# Patient Record
Sex: Male | Born: 1980 | Race: Black or African American | Hispanic: No | Marital: Married | Smoking: Former smoker
Health system: Southern US, Community
[De-identification: ages and names within clinical notes are randomized; demographics above are authoritative.]

---

## 2013-10-15 ENCOUNTER — Ambulatory Visit (INDEPENDENT_AMBULATORY_CARE_PROVIDER_SITE_OTHER): Payer: Managed Care, Other (non HMO) | Admitting: Family Medicine

## 2013-10-15 VITALS — BP 122/80 | HR 79 | Temp 97.6°F | Resp 18 | Ht 72.0 in | Wt 242.0 lb

## 2013-10-15 DIAGNOSIS — R739 Hyperglycemia, unspecified: Secondary | ICD-10-CM

## 2013-10-15 DIAGNOSIS — E663 Overweight: Secondary | ICD-10-CM

## 2013-10-15 DIAGNOSIS — R7309 Other abnormal glucose: Secondary | ICD-10-CM

## 2013-10-15 DIAGNOSIS — Z23 Encounter for immunization: Secondary | ICD-10-CM

## 2013-10-15 DIAGNOSIS — E7800 Pure hypercholesterolemia, unspecified: Secondary | ICD-10-CM

## 2013-10-15 DIAGNOSIS — Z0000 Encounter for general adult medical examination without abnormal findings: Secondary | ICD-10-CM

## 2013-10-15 LAB — COMPLETE METABOLIC PANEL WITH GFR
ALK PHOS: 60 U/L (ref 39–117)
ALT: 66 U/L — ABNORMAL HIGH (ref 0–53)
AST: 37 U/L (ref 0–37)
Albumin: 4.7 g/dL (ref 3.5–5.2)
BUN: 13 mg/dL (ref 6–23)
CO2: 20 mEq/L (ref 19–32)
Calcium: 9.2 mg/dL (ref 8.4–10.5)
Chloride: 104 mEq/L (ref 96–112)
Creat: 0.78 mg/dL (ref 0.50–1.35)
GFR, Est African American: >89 mL/min
GFR, Est Non African American: >89 mL/min
GLUCOSE: 109 mg/dL — AB (ref 70–99)
POTASSIUM: 4.3 meq/L (ref 3.5–5.3)
Sodium: 135 mEq/L (ref 135–145)
TOTAL PROTEIN: 7.2 g/dL (ref 6.0–8.3)
Total Bilirubin: 0.3 mg/dL (ref 0.2–1.2)

## 2013-10-15 LAB — CBC
HEMATOCRIT: 38 % — AB (ref 39.0–52.0)
Hemoglobin: 13 g/dL (ref 13.0–17.0)
MCH: 26.6 pg (ref 26.0–34.0)
MCHC: 34.2 g/dL (ref 30.0–36.0)
MCV: 77.9 fL — AB (ref 78.0–100.0)
Platelets: 336 10*3/uL (ref 150–400)
RBC: 4.88 MIL/uL (ref 4.22–5.81)
RDW: 14.5 % (ref 11.5–15.5)
WBC: 5.1 10*3/uL (ref 4.0–10.5)

## 2013-10-15 LAB — GLUCOSE, POCT (MANUAL RESULT ENTRY): POC Glucose: 97 mg/dl (ref 70–99)

## 2013-10-15 LAB — POCT GLYCOSYLATED HEMOGLOBIN (HGB A1C): HEMOGLOBIN A1C: 5.9

## 2013-10-15 LAB — TSH: TSH: 3.019 u[IU]/mL (ref 0.350–4.500)

## 2013-10-15 LAB — LIPID PANEL
CHOL/HDL RATIO: 4.8 ratio
Cholesterol: 211 mg/dL — ABNORMAL HIGH (ref 0–200)
HDL: 44 mg/dL (ref >39)
LDL Cholesterol: 145 mg/dL — ABNORMAL HIGH (ref 0–99)
Triglycerides: 109 mg/dL (ref <150)
VLDL: 22 mg/dL (ref 0–40)

## 2013-10-15 NOTE — Progress Notes (Addendum)
Subjective:  This chart was scribed for Shade Flood, MD by Leone Payor, ED Scribe. This patient was seen in room 2 and the patient's care was started 10:00 AM.    Patient ID: Nicholas Mendoza, male    DOB: [birth year], 33 y.o.   MRN: [id number]  HPI HPI Comments: Nicholas Mendoza is a 33 y.o. male who presents to [facility] requesting an annual physical exam today. Patient states he had a biometric exam last October/November 2014 and states he had elevated cholesterol and blood sugar levels. He also reports gaining about 40 lbs in the past 1-1.5 years. Patient states his eating habits have worsened and he is no longer exercising. He reports eating fast food once a day.   He reports both parents have history of DM. He states both are not very heavyset.  His last tetanus is unknown. He denies depression symptoms. He has been married for the past 10 months and has a 64 week old newborn at home. He is a daily 2-3 cigarettes smoker. He is not followed by an ophthalmologist or dentist.   There are no active problems to display for this patient.  History reviewed. No pertinent past medical history. History reviewed. No pertinent past surgical history. No Known Allergies Prior to Admission medications   Not on File   History   Social History  . Marital Status: Married    Spouse Name: N/A    Number of Children: N/A  . Years of Education: N/A   Occupational History  . Not on file.   Social History Main Topics  . Smoking status: Current Every Day Smoker  . Smokeless tobacco: Not on file  . Alcohol Use: Yes  . Drug Use: No  . Sexual Activity: Not on file   Other Topics Concern  . Not on file   Social History Narrative  . No narrative on file     Review of Systems  Constitutional: Negative for fatigue and unexpected weight change.  Eyes: Negative for visual disturbance.  Respiratory: Negative for cough, chest tightness and shortness of breath.   Cardiovascular: Negative for chest pain,  palpitations and leg swelling.  Gastrointestinal: Negative for abdominal pain and blood in stool.  Neurological: Negative for dizziness, light-headedness and headaches.  All other systems reviewed and are negative.  13 point review of systems per patient health survey noted.  Negative other than as indicated above.      Objective:   Physical Exam  Vitals reviewed. Constitutional: He is oriented to person, place, and time. He appears well-developed and well-nourished.  Overweight  HENT:  Head: Normocephalic and atraumatic.  Right Ear: External ear normal.  Left Ear: External ear normal.  Mouth/Throat: Oropharynx is clear and moist.  Eyes: Conjunctivae and EOM are normal. Pupils are equal, round, and reactive to light.  Neck: Normal range of motion. Neck supple. No thyromegaly present.  Cardiovascular: Normal rate, regular rhythm, normal heart sounds and intact distal pulses.   Pulmonary/Chest: Effort normal and breath sounds normal. No respiratory distress. He has no wheezes.  Abdominal: Soft. He exhibits no distension. There is no tenderness. Hernia confirmed negative in the right inguinal area and confirmed negative in the left inguinal area.  Genitourinary:  Pearly papules of penis noted on corona. Discussed benign nature.   Musculoskeletal: Normal range of motion. He exhibits no edema and no tenderness.  Lymphadenopathy:    He has no cervical adenopathy.  Neurological: He is alert and oriented to person, place, and  time. He has normal reflexes.  Skin: Skin is warm and dry.  Psychiatric: He has a normal mood and affect. His behavior is normal.     Filed Vitals:   10/15/13 0946  BP: 122/80  Pulse: 79  Temp: 97.6 F (36.4 C)  TempSrc: Oral  Resp: 18  Height: 6' (1.829 m)  Weight: 242 lb (109.77 kg)  SpO2: 99%   Body mass index is 32.81 kg/(m^2).  Results for orders placed in visit on 10/15/13  GLUCOSE, POCT (MANUAL RESULT ENTRY)      Result Value Ref Range   POC  Glucose 97  70 - 99 mg/dl  POCT GLYCOSYLATED HEMOGLOBIN (HGB A1C)      Result Value Ref Range   Hemoglobin A1C 5.9        Assessment & Plan:   Nicholas Mendoza is a 33 y.o. male Hyperglycemia - Plan: POCT glucose (manual entry), POCT glycosylated hemoglobin (Hb A1C), Lipid panel, COMPLETE METABOLIC PANEL WITH GFR, CBC, TSH - prediabetes.  Wt loss and exercise with diet changes stressed as initial treatment. Discussed metabolic syndrome - other labs pending - recheck in 3 months.  Consider intensive behavioral counseling/nutritionist if not improving.   Pure hypercholesterolemia - Plan: POCT glucose (manual entry), POCT glycosylated hemoglobin (Hb A1C), Lipid panel, COMPLETE METABOLIC PANEL WITH GFR, CBC, TSH - by hx - recheck lipid panel.   Need for prophylactic vaccination with combined diphtheria-tetanus-pertussis (DTP) vaccine - Plan: POCT glucose (manual entry), POCT glycosylated hemoglobin (Hb A1C), Lipid panel, COMPLETE METABOLIC PANEL WITH GFR, CBC, TSH - tdap given.   Overweight - Plan: POCT glucose (manual entry), POCT glycosylated hemoglobin (Hb A1C), Lipid panel, COMPLETE METABOLIC PANEL WITH GFR, CBC, TSH - as above.   Routine general medical examination at a health care facility - Plan: POCT glucose (manual entry), POCT glycosylated hemoglobin (Hb A1C), Lipid panel, COMPLETE METABOLIC PANEL WITH GFR, CBC, TSH - labs above. Anticipatory guidance given.    No orders of the defined types were placed in this encounter.   Patient Instructions  Work on exercise as discussed - minimum of 150 minutes moderate exercise per week, or 75 minutes of strenuous exercise (jogging).  Decrease soda and fast food to help with weight loss. Your blood sugar is at "prediabetes" range. See information below, but weight loss important as initial treatment.  Recheck with me in next 3 months to see how this is going.  [place] offers smoking cessation clinics. Registration is required. To register call  [phone] or register online at [email or web address].  You should receive a call or letter about your lab results within the next week to 10 days.      Keeping you healthy  Get these tests  Blood pressure- Have your blood pressure checked once a year by your healthcare provider.  Normal blood pressure is 120/80.  Weight- Have your body mass index (BMI) calculated to screen for obesity.  BMI is a measure of body fat based on height and weight. You can also calculate your own BMI at [email or web address].west-esparza.com/.  Cholesterol- Have your cholesterol checked regularly starting at age 45, sooner may be necessary if you have diabetes, high blood pressure, if a family member developed heart diseases at an early age or if you smoke.   Chlamydia, HIV, and other sexual transmitted disease- Get screened each year until the age of 46 then within three months of each new sexual partner.  Diabetes- Have your blood sugar checked regularly if you have high blood pressure,  high cholesterol, a family history of diabetes or if you are overweight.  Get these vaccines  Flu shot- Every fall.  Tetanus shot- Every 10 years.  Menactra- Single dose; prevents meningitis.  Take these steps  Don't smoke- If you do smoke, ask your healthcare provider about quitting. For tips on how to quit, go to www.smokefree.gov or call 1-800-QUIT-NOW.  Be physically active- Exercise 5 days a week for at least 30 minutes.  If you are not already physically active start slow and gradually work up to 30 minutes of moderate physical activity.  Examples of moderate activity include walking briskly, mowing the yard, dancing, swimming bicycling, etc.  Eat a healthy diet- Eat a variety of healthy foods such as fruits, vegetables, low fat milk, low fat cheese, yogurt, lean meats, poultry, fish, beans, tofu, etc.  For more information on healthy eating, go to www.thenutritionsource.org  Drink alcohol in moderation- Limit alcohol intake two  drinks or less a day.  Never drink and drive.  Dentist- Brush and floss teeth twice daily; visit your dentis twice a year.  Depression-Your emotional health is as important as your physical health.  If you're feeling down, losing interest in things you normally enjoy please talk with your healthcare provider.  Gun Safety- If you keep a gun in your home, keep it unloaded and with the safety lock on.  Bullets should be stored separately.  Helmet use- Always wear a helmet when riding a motorcycle, bicycle, rollerblading or skateboarding.  Safe sex- If you may be exposed to a sexually transmitted infection, use a condom  Seat belts- Seat bels can save your life; always wear one.  Smoke/Carbon Monoxide detectors- These detectors need to be installed on the appropriate level of your home.  Replace batteries at least once a year.  Skin Cancer- When out in the sun, cover up and use sunscreen SPF 15 or higher.  Violence- If anyone is threatening or hurting you, please tell your healthcare provider.  Hyperglycemia Hyperglycemia occurs when the glucose (sugar) in your blood is too high. Hyperglycemia can happen for many reasons, but it most often happens to people who do not know they have diabetes or are not managing their diabetes properly.  CAUSES  Whether you have diabetes or not, there are other causes of hyperglycemia. Hyperglycemia can occur when you have diabetes, but it can also occur in other situations that you might not be as aware of, such as: Diabetes  If you have diabetes and are having problems controlling your blood glucose, hyperglycemia could occur because of some of the following reasons:  Not following your meal plan.  Not taking your diabetes medications or not taking it properly.  Exercising less or doing less activity than you normally do.  Being sick. Pre-diabetes  This cannot be ignored. Before people develop Type 2 diabetes, they almost always have "pre-diabetes."  This is when your blood glucose levels are higher than normal, but not yet high enough to be diagnosed as diabetes. Research has shown that some long-term damage to the body, especially the heart and circulatory system, may already be occurring during pre-diabetes. If you take action to manage your blood glucose when you have pre-diabetes, you may delay or prevent Type 2 diabetes from developing. Stress  If you have diabetes, you may be "diet" controlled or on oral medications or insulin to control your diabetes. However, you may find that your blood glucose is higher than usual in the hospital whether you have diabetes or  not. This is often referred to as "stress hyperglycemia." Stress can elevate your blood glucose. This happens because of hormones put out by the body during times of stress. If stress has been the cause of your high blood glucose, it can be followed regularly by your caregiver. That way he/she can make sure your hyperglycemia does not continue to get worse or progress to diabetes. Steroids  Steroids are medications that act on the infection fighting system (immune system) to block inflammation or infection. One side effect can be a rise in blood glucose. Most people can produce enough extra insulin to allow for this rise, but for those who cannot, steroids make blood glucose levels go even higher. It is not unusual for steroid treatments to "uncover" diabetes that is developing. It is not always possible to determine if the hyperglycemia will go away after the steroids are stopped. A special blood test called an A1c is sometimes done to determine if your blood glucose was elevated before the steroids were started. SYMPTOMS  Thirsty.  Frequent urination.  Dry mouth.  Blurred vision.  Tired or fatigue.  Weakness.  Sleepy.  Tingling in feet or leg. DIAGNOSIS  Diagnosis is made by monitoring blood glucose in one or all of the following ways:  A1c test. This is a chemical  found in your blood.  Fingerstick blood glucose monitoring.  Laboratory results. TREATMENT  First, knowing the cause of the hyperglycemia is important before the hyperglycemia can be treated. Treatment may include, but is not be limited to:  Education.  Change or adjustment in medications.  Change or adjustment in meal plan.  Treatment for an illness, infection, etc.  More frequent blood glucose monitoring.  Change in exercise plan.  Decreasing or stopping steroids.  Lifestyle changes. HOME CARE INSTRUCTIONS   Test your blood glucose as directed.  Exercise regularly. Your caregiver will give you instructions about exercise. Pre-diabetes or diabetes which comes on with stress is helped by exercising.  Eat wholesome, balanced meals. Eat often and at regular, fixed times. Your caregiver or nutritionist will give you a meal plan to guide your sugar intake.  Being at an ideal weight is important. If needed, losing as little as 10 to 15 pounds may help improve blood glucose levels. SEEK MEDICAL CARE IF:   You have questions about medicine, activity, or diet.  You continue to have symptoms (problems such as increased thirst, urination, or weight gain). SEEK IMMEDIATE MEDICAL CARE IF:   You are vomiting or have diarrhea.  Your breath smells fruity.  You are breathing faster or slower.  You are very sleepy or incoherent.  You have numbness, tingling, or pain in your feet or hands.  You have chest pain.  Your symptoms get worse even though you have been following your caregiver's orders.  If you have any other questions or concerns. Document Released: 12/21/2000 Document Revised: 09/19/2011 Document Reviewed: 10/24/2011 [facility] Patient Information 2014 [place], [place].          I personally performed the services described in this documentation, which was scribed in my presence. The recorded information has been reviewed and considered, and addended by me as  needed.

## 2013-10-15 NOTE — Patient Instructions (Addendum)
Work on exercise as discussed - minimum of 150 minutes moderate exercise per week, or 75 minutes of strenuous exercise (jogging).  Decrease soda and fast food to help with weight loss. Your blood sugar is at "prediabetes" range. See information below, but weight loss important as initial treatment.  Recheck with me in next 3 months to see how this is going.  [place] offers smoking cessation clinics. Registration is required. To register call [phone] or register online at [email or web address].  You should receive a call or letter about your lab results within the next week to 10 days.      Keeping you healthy  Get these tests  Blood pressure- Have your blood pressure checked once a year by your healthcare provider.  Normal blood pressure is 120/80.  Weight- Have your body mass index (BMI) calculated to screen for obesity.  BMI is a measure of body fat based on height and weight. You can also calculate your own BMI at [email or web address].west-esparza.com/.  Cholesterol- Have your cholesterol checked regularly starting at age 69, sooner may be necessary if you have diabetes, high blood pressure, if a family member developed heart diseases at an early age or if you smoke.   Chlamydia, HIV, and other sexual transmitted disease- Get screened each year until the age of 76 then within three months of each new sexual partner.  Diabetes- Have your blood sugar checked regularly if you have high blood pressure, high cholesterol, a family history of diabetes or if you are overweight.  Get these vaccines  Flu shot- Every fall.  Tetanus shot- Every 10 years.  Menactra- Single dose; prevents meningitis.  Take these steps  Don't smoke- If you do smoke, ask your healthcare provider about quitting. For tips on how to quit, go to www.smokefree.gov or call 1-800-QUIT-NOW.  Be physically active- Exercise 5 days a week for at least 30 minutes.  If you are not already physically active start slow and gradually  work up to 30 minutes of moderate physical activity.  Examples of moderate activity include walking briskly, mowing the yard, dancing, swimming bicycling, etc.  Eat a healthy diet- Eat a variety of healthy foods such as fruits, vegetables, low fat milk, low fat cheese, yogurt, lean meats, poultry, fish, beans, tofu, etc.  For more information on healthy eating, go to www.thenutritionsource.org  Drink alcohol in moderation- Limit alcohol intake two drinks or less a day.  Never drink and drive.  Dentist- Brush and floss teeth twice daily; visit your dentis twice a year.  Depression-Your emotional health is as important as your physical health.  If you're feeling down, losing interest in things you normally enjoy please talk with your healthcare provider.  Gun Safety- If you keep a gun in your home, keep it unloaded and with the safety lock on.  Bullets should be stored separately.  Helmet use- Always wear a helmet when riding a motorcycle, bicycle, rollerblading or skateboarding.  Safe sex- If you may be exposed to a sexually transmitted infection, use a condom  Seat belts- Seat bels can save your life; always wear one.  Smoke/Carbon Monoxide detectors- These detectors need to be installed on the appropriate level of your home.  Replace batteries at least once a year.  Skin Cancer- When out in the sun, cover up and use sunscreen SPF 15 or higher.  Violence- If anyone is threatening or hurting you, please tell your healthcare provider.  Hyperglycemia Hyperglycemia occurs when the glucose (sugar) in your blood is too high.  Hyperglycemia can happen for many reasons, but it most often happens to people who do not know they have diabetes or are not managing their diabetes properly.  CAUSES  Whether you have diabetes or not, there are other causes of hyperglycemia. Hyperglycemia can occur when you have diabetes, but it can also occur in other situations that you might not be as aware of, such  as: Diabetes  If you have diabetes and are having problems controlling your blood glucose, hyperglycemia could occur because of some of the following reasons:  Not following your meal plan.  Not taking your diabetes medications or not taking it properly.  Exercising less or doing less activity than you normally do.  Being sick. Pre-diabetes  This cannot be ignored. Before people develop Type 2 diabetes, they almost always have "pre-diabetes." This is when your blood glucose levels are higher than normal, but not yet high enough to be diagnosed as diabetes. Research has shown that some long-term damage to the body, especially the heart and circulatory system, may already be occurring during pre-diabetes. If you take action to manage your blood glucose when you have pre-diabetes, you may delay or prevent Type 2 diabetes from developing. Stress  If you have diabetes, you may be "diet" controlled or on oral medications or insulin to control your diabetes. However, you may find that your blood glucose is higher than usual in the hospital whether you have diabetes or not. This is often referred to as "stress hyperglycemia." Stress can elevate your blood glucose. This happens because of hormones put out by the body during times of stress. If stress has been the cause of your high blood glucose, it can be followed regularly by your caregiver. That way he/she can make sure your hyperglycemia does not continue to get worse or progress to diabetes. Steroids  Steroids are medications that act on the infection fighting system (immune system) to block inflammation or infection. One side effect can be a rise in blood glucose. Most people can produce enough extra insulin to allow for this rise, but for those who cannot, steroids make blood glucose levels go even higher. It is not unusual for steroid treatments to "uncover" diabetes that is developing. It is not always possible to determine if the hyperglycemia  will go away after the steroids are stopped. A special blood test called an A1c is sometimes done to determine if your blood glucose was elevated before the steroids were started. SYMPTOMS  Thirsty.  Frequent urination.  Dry mouth.  Blurred vision.  Tired or fatigue.  Weakness.  Sleepy.  Tingling in feet or leg. DIAGNOSIS  Diagnosis is made by monitoring blood glucose in one or all of the following ways:  A1c test. This is a chemical found in your blood.  Fingerstick blood glucose monitoring.  Laboratory results. TREATMENT  First, knowing the cause of the hyperglycemia is important before the hyperglycemia can be treated. Treatment may include, but is not be limited to:  Education.  Change or adjustment in medications.  Change or adjustment in meal plan.  Treatment for an illness, infection, etc.  More frequent blood glucose monitoring.  Change in exercise plan.  Decreasing or stopping steroids.  Lifestyle changes. HOME CARE INSTRUCTIONS   Test your blood glucose as directed.  Exercise regularly. Your caregiver will give you instructions about exercise. Pre-diabetes or diabetes which comes on with stress is helped by exercising.  Eat wholesome, balanced meals. Eat often and at regular, fixed times. Your caregiver or  nutritionist will give you a meal plan to guide your sugar intake.  Being at an ideal weight is important. If needed, losing as little as 10 to 15 pounds may help improve blood glucose levels. SEEK MEDICAL CARE IF:   You have questions about medicine, activity, or diet.  You continue to have symptoms (problems such as increased thirst, urination, or weight gain). SEEK IMMEDIATE MEDICAL CARE IF:   You are vomiting or have diarrhea.  Your breath smells fruity.  You are breathing faster or slower.  You are very sleepy or incoherent.  You have numbness, tingling, or pain in your feet or hands.  You have chest pain.  Your symptoms get  worse even though you have been following your caregiver's orders.  If you have any other questions or concerns. Document Released: 12/21/2000 Document Revised: 09/19/2011 Document Reviewed: 10/24/2011 [facility] Patient Information 2014 [place], [place].

## 2013-10-17 NOTE — Progress Notes (Signed)
Left a message for patient to return call for follow up appt in three months for folllow up appointment

## 2014-01-20 ENCOUNTER — Ambulatory Visit: Payer: Self-pay | Admitting: Family Medicine

## 2018-10-05 ENCOUNTER — Emergency Department (HOSPITAL_COMMUNITY)
Admission: EM | Admit: 2018-10-05 | Discharge: 2018-10-05 | Disposition: A | Discharge status: Home / Self Care | Payer: Commercial Managed Care - PPO | Attending: Emergency Medicine | Admitting: Emergency Medicine

## 2018-10-05 ENCOUNTER — Other Ambulatory Visit: Payer: Self-pay

## 2018-10-05 ENCOUNTER — Emergency Department (HOSPITAL_COMMUNITY): Payer: Commercial Managed Care - PPO

## 2018-10-05 ENCOUNTER — Encounter (HOSPITAL_COMMUNITY): Payer: Self-pay

## 2018-10-05 DIAGNOSIS — F17210 Nicotine dependence, cigarettes, uncomplicated: Secondary | ICD-10-CM | POA: Insufficient documentation

## 2018-10-05 DIAGNOSIS — Z79899 Other long term (current) drug therapy: Secondary | ICD-10-CM | POA: Insufficient documentation

## 2018-10-05 DIAGNOSIS — R0789 Other chest pain: Secondary | ICD-10-CM | POA: Diagnosis not present

## 2018-10-05 LAB — TROPONIN I: Troponin I: <0.03 ng/mL (ref <0.03)

## 2018-10-05 LAB — CBC WITH DIFFERENTIAL/PLATELET
ABS IMMATURE GRANULOCYTES: 0.01 10*3/uL (ref 0.00–0.07)
Basophils Absolute: 0 10*3/uL (ref 0.0–0.1)
Basophils Relative: 1 %
Eosinophils Absolute: 0.1 10*3/uL (ref 0.0–0.5)
Eosinophils Relative: 3 %
HCT: 40.1 % (ref 39.0–52.0)
Hemoglobin: 13.4 g/dL (ref 13.0–17.0)
Immature Granulocytes: 0 %
Lymphocytes Relative: 39 %
Lymphs Abs: 1.6 10*3/uL (ref 0.7–4.0)
MCH: 26.9 pg (ref 26.0–34.0)
MCHC: 33.4 g/dL (ref 30.0–36.0)
MCV: 80.5 fL (ref 80.0–100.0)
MONOS PCT: 10 %
Monocytes Absolute: 0.4 10*3/uL (ref 0.1–1.0)
Neutro Abs: 2 10*3/uL (ref 1.7–7.7)
Neutrophils Relative %: 47 %
Platelets: 292 10*3/uL (ref 150–400)
RBC: 4.98 MIL/uL (ref 4.22–5.81)
RDW: 14.9 % (ref 11.5–15.5)
WBC: 4.2 10*3/uL (ref 4.0–10.5)
nRBC: 0 % (ref 0.0–0.2)

## 2018-10-05 LAB — COMPREHENSIVE METABOLIC PANEL
ALT: 45 U/L — AB (ref 0–44)
AST: 35 U/L (ref 15–41)
Albumin: 4.8 g/dL (ref 3.5–5.0)
Alkaline Phosphatase: 40 U/L (ref 38–126)
Anion gap: 14 (ref 5–15)
BUN: 8 mg/dL (ref 6–20)
CO2: 21 mmol/L — ABNORMAL LOW (ref 22–32)
Calcium: 9.6 mg/dL (ref 8.9–10.3)
Chloride: 102 mmol/L (ref 98–111)
Creatinine, Ser: 0.73 mg/dL (ref 0.61–1.24)
GFR calc Af Amer: >60 mL/min (ref >60)
GFR calc non Af Amer: >60 mL/min (ref >60)
Glucose, Bld: 95 mg/dL (ref 70–99)
Potassium: 4 mmol/L (ref 3.5–5.1)
Sodium: 137 mmol/L (ref 135–145)
Total Bilirubin: 1 mg/dL (ref 0.3–1.2)
Total Protein: 7.7 g/dL (ref 6.5–8.1)

## 2018-10-05 MED ORDER — METHOCARBAMOL 500 MG PO TABS: 500.0000 mg | ORAL_TABLET | Freq: Two times a day (BID) | ORAL | 0 refills | Status: AC

## 2018-10-05 NOTE — ED Provider Notes (Signed)
[facility] EMERGENCY DEPARTMENT Provider Note   CSN: [id number] Arrival date & time: 10/05/18  1516    History   Chief Complaint Chief Complaint  Patient presents with  . Abnormal ECG    HPI Nicholas Mendoza is a 38 y.o. male with no significant past medical history who presents the emergency department directly from urgent care after he had an EKG that was read as concerning.  Patient states that he presented to an urgent care today as he began to experience right-sided chest pain yesterday afternoon.  He states that the pain is located over his right pectoral muscle and feels like a sharpness.  He states that it is constant and is worsened with sitting up or movement of his neck.  He denies any injury to this area or activity that would have caused the symptoms.  He did state that he has been working at home with his young children and may have injured it while crawling them.  He denies any fevers, chills, cough/cold/congestion, dyspnea, diaphoresis, nausea/vomiting, abdominal pain, or changes in bowel or bladder habits.      Illness  Severity:  Moderate Onset quality:  Gradual Duration:  2 days Timing:  Constant Progression:  Improving Chronicity:  New Associated symptoms: chest pain   Associated symptoms: no abdominal pain, no cough, no ear pain, no fever, no rash, no shortness of breath, no sore throat and no vomiting     No past medical history on file.  There are no active problems to display for this patient.   No past surgical history on file.      Home Medications    Prior to Admission medications   Medication Sig Start Date End Date Taking? Authorizing Provider  acetaminophen (TYLENOL) 500 MG tablet Take 1,000 mg by mouth every 6 (six) hours as needed for moderate pain.   Yes [provider]  methocarbamol (ROBAXIN) 500 MG tablet Take 1 tablet (500 mg total) by mouth 2 (two) times daily for 10 doses. 10/05/18 10/10/18  Leonette Monarch, MD     Family History Family History  Problem Relation Age of Onset  . Diabetes Mother   . Diabetes Father     Social History Social History   Tobacco Use  . Smoking status: Current Every Day Smoker    Packs/day: 0.50    Types: Cigarettes  . Smokeless tobacco: Never Used  Substance Use Topics  . Alcohol use: Yes  . Drug use: No     Allergies   Patient has no known allergies.   Review of Systems Review of Systems  Constitutional: Negative for chills and fever.  HENT: Negative for ear pain and sore throat.   Eyes: Negative for pain and visual disturbance.  Respiratory: Negative for cough and shortness of breath.   Cardiovascular: Positive for chest pain. Negative for palpitations.  Gastrointestinal: Negative for abdominal pain and vomiting.  Genitourinary: Negative for dysuria and hematuria.  Musculoskeletal: Negative for arthralgias and back pain.  Skin: Negative for color change and rash.  Neurological: Negative for seizures and syncope.  All other systems reviewed and are negative.    Physical Exam Updated Vital Signs BP (!) 139/106   Pulse 78   Temp (!) 97.4 F (36.3 C) (Oral)   Resp 18   SpO2 99%   Physical Exam Vitals signs and nursing note reviewed.  Constitutional:      Appearance: He is well-developed.  HENT:     Head: Normocephalic and atraumatic.  Eyes:     Conjunctiva/sclera: Conjunctivae normal.  Neck:     Musculoskeletal: Neck supple.  Cardiovascular:     Rate and Rhythm: Normal rate and regular rhythm.     Heart sounds: No murmur.  Pulmonary:     Effort: Pulmonary effort is normal. No respiratory distress.     Breath sounds: Normal breath sounds.  Abdominal:     Palpations: Abdomen is soft.     Tenderness: There is no abdominal tenderness.  Musculoskeletal:     Comments: No tenderness to palpation overlying the right chest wall.  There is no bony deformities or palpable crepitus.  No overlying erythematous rash or vesicular lesions.   Skin:    General: Skin is warm and dry.  Neurological:     General: No focal deficit present.     Mental Status: He is alert and oriented to person, place, and time.     Comments: 5-5 strength in bilateral upper and lower extremities.  Sensation intact throughout.      ED Treatments / Results  Labs (all labs ordered are listed, but only abnormal results are displayed) Labs Reviewed  COMPREHENSIVE METABOLIC PANEL - Abnormal; Notable for the following components:      Result Value   CO2 21 (*)    ALT 45 (*)    All other components within normal limits  CBC WITH DIFFERENTIAL/PLATELET  TROPONIN I    EKG EKG Interpretation  Date/Time:  Friday October 05 2018 15:22:40 EDT Ventricular Rate:  82 PR Interval:  186 QRS Duration: 90 QT Interval:  380 QTC Calculation: 443 R Axis:   106 Text Interpretation:  Normal sinus rhythm Rightward axis Anterior infarct , age undetermined Abnormal ECG No prior ECG for comparison. No STEMI Confirmed by Theda Belfast ([id number]) on 10/05/2018 3:28:11 PM   Radiology Dg Chest Portable 1 View  Result Date: 10/05/2018 CLINICAL DATA:  Acute chest pain EXAM: PORTABLE CHEST 1 VIEW COMPARISON:  None. FINDINGS: The heart size and mediastinal contours are within normal limits. Both lungs are clear. The visualized skeletal structures are unremarkable. IMPRESSION: No active disease. Electronically Signed   By: Judie Petit.  Shick M.D.   On: 10/05/2018 16:20    Procedures Procedures (including critical care time)  Medications Ordered in ED Medications - No data to display   Initial Impression / Assessment and Plan / ED Course  I have reviewed the triage vital signs and the nursing notes.  Pertinent labs & imaging results that were available during my care of the patient were reviewed by me and considered in my medical decision making (see chart for details).        Patient is a 38 year old male with no significant past medical history who presented to the  emergency department complaining of right-sided chest pain that is been constant for the past 2 days.  Patient states that the pain is worse with side-to-side movement of his neck and with going from a lying down to seated position.  Very much reproducible with movement.  He was seen in urgent care where EKG was obtained and he was told there were some concerning findings and was sent to the emergency department for evaluation.  On initial evaluation of the patient he was hemodynamically stable nontoxic-appearing.  Patient was afebrile, not tachycardic, mildly hypertensive at 140/99, satting 99% on room air.  Physical exam as detailed above which is remarkable for overall well-appearing young African-American male.  Lungs are clear to auscultation and no murmurs rubs or gallops are  present.  He has equal pulses in all 4 extremities.  No edema of the lower extremities.  Pain is reproducible with lateral rotation of the neck however no tenderness palpation directly over cervical spine and no neurologic deficits appreciated in either of his upper extremities.  EKG was normal sinus rhythm at 82 bpm with right axis deviation.  No evidence of WPW or Brugada.  Patient has no ST or T wave abnormalities concerning for acute ischemia.  Chest x-ray showing no evidence of acute cardiac or pulmonary pathology.  Specifically no signs of focal consolidations or be concerning for pneumonia.  CBC and CMP within normal limits.  Troponin undetectable.  Given patient's description of his symptoms I have high suspicion for musculoskeletal etiology and ACS is felt to be highly unlikely.  He also states that his pain is been constant since onset yesterday leaving delta troponin is unnecessary.  PERC negative leaving PE as also unlikely.  Patient's presenting symptoms most likely secondary to strained chest wall musculature.  Patient was given prescription for Robaxin to return home with.  I went over appropriate medication  usage and side effects.  I discussed concerning signs and symptoms that would necessitate return to the emergency department.  Patient voiced understanding of these instructions and had no further questions at this time.  Nicholas Mendoza was evaluated in Emergency Department on 10/06/2018 for the symptoms described in the history of present illness. He was evaluated in the context of the global COVID-19 pandemic, which necessitated consideration that the patient might be at risk for infection with the SARS-CoV-2 virus that causes COVID-19. Institutional protocols and algorithms that pertain to the evaluation of patients at risk for COVID-19 are in a state of rapid change based on information released by regulatory bodies including the CDC and federal and state organizations. These policies and algorithms were followed during the patient's care in the ED.   Final Clinical Impressions(s) / ED Diagnoses   Final diagnoses:  Chest wall pain    ED Discharge Orders         Ordered    methocarbamol (ROBAXIN) 500 MG tablet  2 times daily     10/05/18 1825           Leonette Monarch, MD 10/06/18 0110    Tegeler, Canary Brim, MD 10/06/18 [phone]

## 2018-10-05 NOTE — ED Triage Notes (Signed)
Pt reports that he had some chest pain that started yesterday afternoon. Pt reports it was a gradual onset. Pt reports that he went to urgent care today and they sent him here for an abnormal EKG. Pt reports the pain has been sharp and is constant and that the pain worsens when he tries to get up and when he moves his head.

## 2019-08-11 IMAGING — DX PORTABLE CHEST - 1 VIEW
1 series · 1 of 1 positions shown · non-contrast
Comparison: None.

CLINICAL DATA: Acute chest pain

EXAM:
PORTABLE CHEST 1 VIEW

[chest]
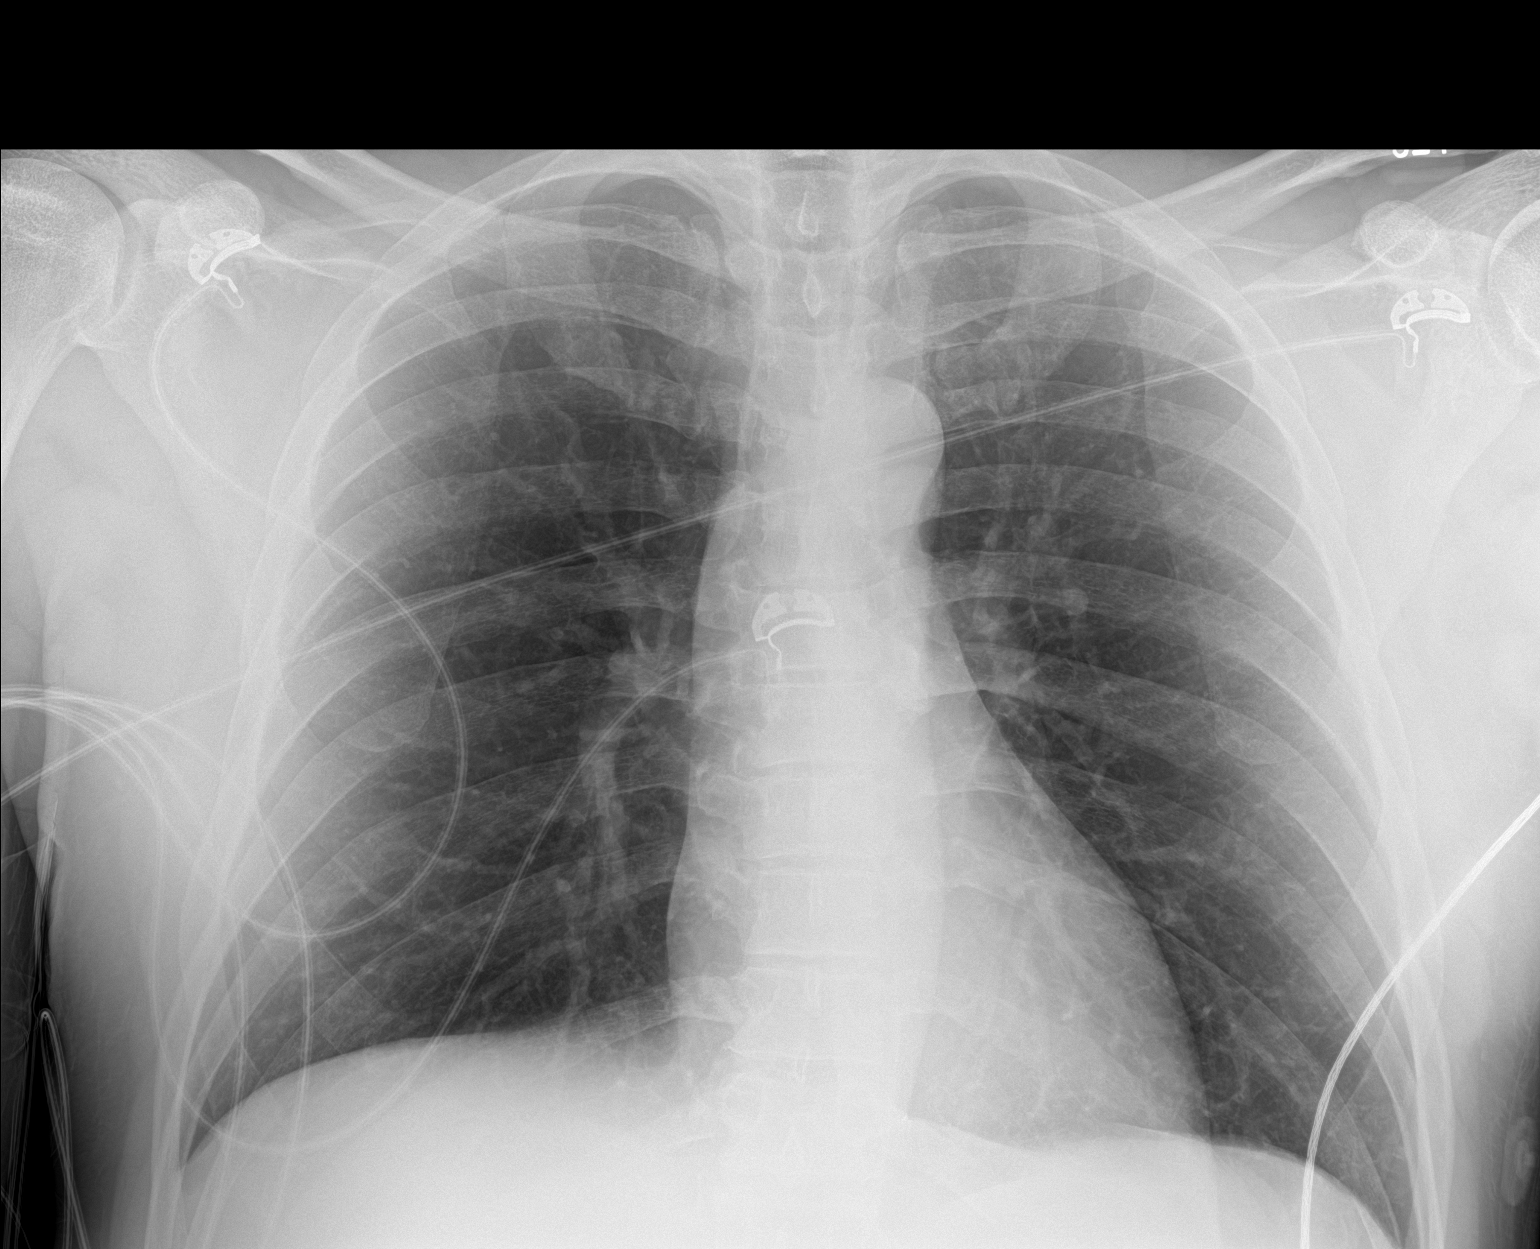

[1 of 1 positions shown; findings below may reference images not displayed]

FINDINGS: The heart size and mediastinal contours are within normal limits.
Both lungs are clear. The visualized skeletal structures are
unremarkable.
IMPRESSION: No active disease.

## 2019-10-18 ENCOUNTER — Ambulatory Visit: Payer: Commercial Managed Care - PPO

## 2019-10-19 ENCOUNTER — Ambulatory Visit: Payer: Commercial Managed Care - PPO

## 2019-10-19 ENCOUNTER — Ambulatory Visit: Payer: Self-pay | Attending: Internal Medicine

## 2020-03-25 ENCOUNTER — Other Ambulatory Visit: Payer: Self-pay | Admitting: Critical Care Medicine

## 2020-03-25 ENCOUNTER — Other Ambulatory Visit: Payer: Self-pay

## 2020-03-25 DIAGNOSIS — Z20822 Contact with and (suspected) exposure to covid-19: Secondary | ICD-10-CM

## 2020-03-27 LAB — SARS-COV-2, NAA 2 DAY TAT

## 2020-03-27 LAB — NOVEL CORONAVIRUS, NAA: SARS-CoV-2, NAA: DETECTED — AB

## 2020-03-28 ENCOUNTER — Encounter: Payer: Self-pay | Admitting: Nurse Practitioner

## 2020-03-28 ENCOUNTER — Telehealth (HOSPITAL_COMMUNITY): Payer: Self-pay | Admitting: Nurse Practitioner

## 2020-03-28 DIAGNOSIS — U071 COVID-19: Secondary | ICD-10-CM

## 2020-03-28 NOTE — Telephone Encounter (Signed)
Sent patient mychart message to discuss covid symptoms and the use of regeneron, a monoclonal antibody infusion for those with mild to moderate Covid symptoms and at a high risk of hospitalization.     Pt is qualified for this infusion at the [facility] due to co-morbid conditions and/or a member of an at-risk group.    Consuello Masse, DNP, AGNP-C [phone] (Infusion Center Hotline)

## 2023-08-01 ENCOUNTER — Other Ambulatory Visit: Payer: Self-pay | Admitting: Nurse Practitioner

## 2023-08-01 DIAGNOSIS — Z789 Other specified health status: Secondary | ICD-10-CM

## 2023-08-01 DIAGNOSIS — R7989 Other specified abnormal findings of blood chemistry: Secondary | ICD-10-CM

## 2023-08-01 DIAGNOSIS — K760 Fatty (change of) liver, not elsewhere classified: Secondary | ICD-10-CM

## 2023-08-14 ENCOUNTER — Other Ambulatory Visit: Payer: Self-pay

## 2023-08-15 ENCOUNTER — Other Ambulatory Visit: Payer: Self-pay

## 2023-08-22 ENCOUNTER — Ambulatory Visit
Admission: RE | Admit: 2023-08-22 | Discharge: 2023-08-22 | Disposition: A | Discharge status: Home / Self Care | Payer: BC Managed Care – PPO | Source: Ambulatory Visit | Attending: Nurse Practitioner | Admitting: Nurse Practitioner

## 2023-08-22 DIAGNOSIS — Z789 Other specified health status: Secondary | ICD-10-CM

## 2023-08-22 DIAGNOSIS — K760 Fatty (change of) liver, not elsewhere classified: Secondary | ICD-10-CM

## 2023-08-22 DIAGNOSIS — R7989 Other specified abnormal findings of blood chemistry: Secondary | ICD-10-CM

## 2024-04-24 ENCOUNTER — Encounter (HOSPITAL_COMMUNITY): Payer: Self-pay

## 2024-04-24 ENCOUNTER — Ambulatory Visit (HOSPITAL_COMMUNITY)
Admission: EM | Admit: 2024-04-24 | Discharge: 2024-04-24 | Disposition: A | Discharge status: Home / Self Care | Attending: Family Medicine | Admitting: Family Medicine

## 2024-04-24 DIAGNOSIS — M7989 Other specified soft tissue disorders: Secondary | ICD-10-CM

## 2024-04-24 DIAGNOSIS — L03114 Cellulitis of left upper limb: Secondary | ICD-10-CM

## 2024-04-24 DIAGNOSIS — T7840XA Allergy, unspecified, initial encounter: Secondary | ICD-10-CM | POA: Diagnosis not present

## 2024-04-24 MED ORDER — CEFTRIAXONE SODIUM 1 G IJ SOLR
1.0000 g | Freq: Once | INTRAMUSCULAR | Status: AC
  Administered 2024-04-24: 1 g via INTRAMUSCULAR

## 2024-04-24 MED ORDER — HYDROCODONE-ACETAMINOPHEN 5-325 MG PO TABS
1.0000 | ORAL_TABLET | Freq: Once | ORAL | Status: AC
  Administered 2024-04-24: 1 via ORAL

## 2024-04-24 MED ORDER — PREDNISONE 20 MG PO TABS: 40.0000 mg | ORAL_TABLET | Freq: Every day | ORAL | 0 refills | Status: AC

## 2024-04-24 MED ORDER — METHYLPREDNISOLONE SODIUM SUCC 125 MG IJ SOLR
125.0000 mg | Freq: Once | INTRAMUSCULAR | Status: AC
  Administered 2024-04-24: 125 mg via INTRAMUSCULAR

## 2024-04-24 MED ORDER — HYDROXYZINE HCL 25 MG PO TABS: ORAL_TABLET | ORAL | 0 refills | Status: AC

## 2024-04-24 MED ORDER — LIDOCAINE HCL (PF) 1 % IJ SOLN
INTRAMUSCULAR | Status: AC
  Filled 2024-04-24: qty 2

## 2024-04-24 MED ORDER — METHYLPREDNISOLONE SODIUM SUCC 125 MG IJ SOLR
INTRAMUSCULAR | Status: AC
  Filled 2024-04-24: qty 2

## 2024-04-24 MED ORDER — HYDROCODONE-ACETAMINOPHEN 5-325 MG PO TABS
ORAL_TABLET | ORAL | Status: AC
  Filled 2024-04-24: qty 1

## 2024-04-24 MED ORDER — CEFTRIAXONE SODIUM 1 G IJ SOLR
INTRAMUSCULAR | Status: AC
  Filled 2024-04-24: qty 10

## 2024-04-24 MED ORDER — AMOXICILLIN-POT CLAVULANATE 875-125 MG PO TABS: 1.0000 | ORAL_TABLET | Freq: Two times a day (BID) | ORAL | 0 refills | Status: AC

## 2024-04-24 NOTE — Medical Student Note (Signed)
 [facility] Insurance account manager Note For educational purposes for Medical, PA and NP students only and not part of the legal medical record.   CSN: [id number] Arrival date & time: 04/24/24  0930      History   Chief Complaint Chief Complaint  Patient presents with   Insect Bite    HPI Nicholas Mendoza is a 43 y.o. male.  Pt has a hx of GERD and elevated LFTs. He presents with L hand swelling that started yesterday after he felt something sting his L 4th digit while working in his yard yesterday. The hand is pruritic with significant swelling. He had a small blister at the site of the sting that has ruptured and has clear drainage. He is not aware of any allergies to insect stings. He denies fever, chills, N/V, or ShOB. He has not taken any medication for his symptoms. He has a wedding ring in place. He still has normal sensation distally.   The history is provided by the patient.    History reviewed. No pertinent past medical history.  There are no active problems to display for this patient.   History reviewed. No pertinent surgical history.     Home Medications    Prior to Admission medications   Medication Sig Start Date End Date Taking? Authorizing Provider  pantoprazole (PROTONIX) 40 MG tablet Take 40 mg by mouth daily. 11/22/23  Yes [provider]  acetaminophen (TYLENOL) 500 MG tablet Take 1,000 mg by mouth every 6 (six) hours as needed for moderate pain.    [provider]    Family History Family History  Problem Relation Age of Onset   Diabetes Mother    Diabetes Father     Social History Social History   Tobacco Use   Smoking status: Former    Current packs/day: 0.50    Types: Cigarettes   Smokeless tobacco: Never  Vaping Use   Vaping status: Every Day  Substance Use Topics   Alcohol use: Yes    Comment: moderate   Drug use: No     Allergies   Patient has no known allergies.   Review of Systems Review of  Systems  Constitutional:  Negative for chills and fever.  HENT:  Negative for trouble swallowing.   Respiratory:  Negative for shortness of breath and wheezing.   Skin:  Positive for rash (pruritis, vesicle).  All other systems reviewed and are negative.    Physical Exam Updated Vital Signs BP (!) 142/100 (BP Location: Right Arm)   Pulse 86   Temp 98.9 F (37.2 C) (Oral)   Resp 16   SpO2 96%   Physical Exam Constitutional:      Appearance: Normal appearance.  HENT:     Mouth/Throat:     Mouth: Mucous membranes are moist.     Pharynx: Oropharynx is clear.  Cardiovascular:     Rate and Rhythm: Normal rate and regular rhythm.     Pulses: Normal pulses.     Heart sounds: Normal heart sounds.  Pulmonary:     Effort: Pulmonary effort is normal.     Breath sounds: Normal breath sounds.  Musculoskeletal:     Right hand: Normal.     Left hand: Swelling present. Normal sensation. There is no disruption of two-point discrimination. Normal capillary refill.     Comments: Significant swelling to the 4th digit on the L hand with several small vesicles some are intact and others are ruptured with serous drainage. Ring  in place that is beginning to strangulate the finger. Distal sensation is intact and cap refill is 2 sec. Significant swelling to the hand that extends to the L elbow with diffuse poorly defined erythema and warmth. Axillary lymph node enlarged.   Neurological:     Mental Status: He is alert.      ED Treatments / Results  Labs (all labs ordered are listed, but only abnormal results are displayed) Labs Reviewed - No data to display  EKG  Radiology No results found.  Procedures Procedures (including critical care time)  Medications Ordered in ED Medications  cefTRIAXone (ROCEPHIN) injection 1 g (has no administration in time range)  methylPREDNISolone sodium succinate (SOLU-MEDROL) 125 mg/2 mL injection 125 mg (has no administration in time range)   HYDROcodone-acetaminophen (NORCO/VICODIN) 5-325 MG per tablet 1 tablet (has no administration in time range)     Initial Impression / Assessment and Plan / ED Course  I have reviewed the triage vital signs and the nursing notes.  Pertinent labs & imaging results that were available during my care of the patient were reviewed by me and considered in my medical decision making (see chart for details).     Nursing staff removed ring from finger.   125 mg of solumedrol, 1gm of Rocephin IM, and Norco 5-325 mg given in the clinic.   Allergic reaction from insect sting and cellulitis of the L arm. Will start patient on Augmentin 875-125 mg PO BID for 10 days. Prednisone 40 mg daily for 5 days. Will also provide hydroxyzine 25 mg PO every 6 hours as needed for itching. Counseled patient to watch for worsening signs of infection including constitutional signs of fever, malaise, N/V and if present report to the ED for further evaluation and treatment. Sedation precautions given concerning medication given today and hydroxyzine. Red flag symptoms reviewed and return precautions given.    Final Clinical Impressions(s) / ED Diagnoses   Final diagnoses:  None    New Prescriptions New Prescriptions   No medications on file

## 2024-04-24 NOTE — Discharge Instructions (Signed)
 Meds ordered this encounter  Medications   cefTRIAXone (ROCEPHIN) injection 1 g   methylPREDNISolone sodium succinate (SOLU-MEDROL) 125 mg/2 mL injection 125 mg   HYDROcodone-acetaminophen (NORCO/VICODIN) 5-325 MG per tablet 1 tablet    Refill:  0   amoxicillin-clavulanate (AUGMENTIN) 875-125 MG tablet    Sig: Take 1 tablet by mouth every 12 (twelve) hours.    Dispense:  20 tablet    Refill:  0   predniSONE (DELTASONE) 20 MG tablet    Sig: Take 2 tablets (40 mg total) by mouth daily.    Dispense:  10 tablet    Refill:  0   hydrOXYzine (ATARAX) 25 MG tablet    Sig: Take 1-2 tablets every 6 hours as needed for itching.    Dispense:  30 tablet    Refill:  0

## 2024-04-24 NOTE — ED Provider Notes (Signed)
 [facility] CARE CENTER   [id number] 04/24/24 Arrival Time: 0930      History              Chief Complaint    Chief Complaint  Patient presents with   Insect Bite      HPI Nicholas Mendoza is a 43 y.o. male.   Pt has a hx of GERD and elevated LFTs. He presents with L hand swelling that started yesterday after he felt something sting his L 4th digit while working in his yard yesterday. The hand is pruritic with significant swelling. He had a small blister at the site of the sting that has ruptured and has clear drainage. He is not aware of any allergies to insect stings. He denies fever, chills, N/V, or ShOB. He has not taken any medication for his symptoms. He has a wedding ring in place. He still has normal sensation distally.   The history is provided by the patient.     History reviewed. No pertinent past medical history.       There are no active problems to display for this patient.     History reviewed. No pertinent surgical history.             Home Medications                       Prior to Admission medications   Medication Sig Start Date End Date Taking? Authorizing Provider  pantoprazole (PROTONIX) 40 MG tablet Take 40 mg by mouth daily. 11/22/23   Yes [provider]  acetaminophen (TYLENOL) 500 MG tablet Take 1,000 mg by mouth every 6 (six) hours as needed for moderate pain.       [provider]      Family History      Family History  Problem Relation Age of Onset   Diabetes Mother     Diabetes Father            Social History Social History  Social History         Tobacco Use   Smoking status: Former      Current packs/day: 0.50      Types: Cigarettes   Smokeless tobacco: Never  Vaping Use   Vaping status: Every Day  Substance Use Topics   Alcohol use: Yes      Comment: moderate   Drug use: No          Allergies              Patient has no known allergies.     Review of Systems Review of Systems   Constitutional:  Negative for chills and fever.  HENT:  Negative for trouble swallowing.   Respiratory:  Negative for shortness of breath and wheezing.   Skin:  Positive for rash (pruritis, vesicle).  All other systems reviewed and are negative.      Physical Exam Updated Vital Signs BP (!) 142/100 (BP Location: Right Arm)   Pulse 86   Temp 98.9 F (37.2 C) (Oral)   Resp 16   SpO2 96%    Physical Exam Constitutional:      Appearance: Normal appearance.  HENT:     Mouth/Throat:     Mouth: Mucous membranes are moist.     Pharynx: Oropharynx is clear.  Cardiovascular:     Rate and Rhythm: Normal rate and regular rhythm.     Pulses: Normal pulses.     Heart sounds: Normal  heart sounds.  Pulmonary:     Effort: Pulmonary effort is normal.     Breath sounds: Normal breath sounds.  Musculoskeletal:     Right hand: Normal.     Left hand: Swelling present. Normal sensation. There is no disruption of two-point discrimination. Normal capillary refill.     Comments: Significant swelling to the 4th digit on the L hand with several small vesicles some are intact and others are ruptured with serous drainage. Ring in place that is beginning to strangulate the finger. Distal sensation is intact and cap refill is 2 sec. Significant swelling to the hand that extends to the L elbow with diffuse poorly defined erythema and warmth. Axillary lymph node enlarged.   Neurological:     Mental Status: He is alert.        ED Treatments / Results  Labs (all labs ordered are listed, but only abnormal results are displayed) Labs Reviewed - No data to display   EKG   Radiology Imaging Results (Last 48 hours)  No results found.     Procedures Procedures (including critical care time)   Medications Ordered in ED Medications  cefTRIAXone (ROCEPHIN) injection 1 g (has no administration in time range)  methylPREDNISolone sodium succinate (SOLU-MEDROL) 125 mg/2 mL injection 125 mg (has no  administration in time range)  HYDROcodone-acetaminophen (NORCO/VICODIN) 5-325 MG per tablet 1 tablet (has no administration in time range)        Initial Impression / Assessment and Plan / ED Course  I have reviewed the triage vital signs and the nursing notes.   Pertinent labs & imaging results that were available during my care of the patient were reviewed by me and considered in my medical decision making (see chart for details).     Nursing staff removed ring from finger.    125 mg of solumedrol, 1gm of Rocephin IM, and Norco 5-325 mg given in the clinic.    Allergic reaction from insect sting and cellulitis of the L arm. Will start patient on Augmentin 875-125 mg PO BID for 10 days. Prednisone 40 mg daily for 5 days. Will also provide hydroxyzine 25 mg PO every 6 hours as needed for itching. Counseled patient to watch for worsening signs of infection including constitutional signs of fever, malaise, N/V and if present report to the ED for further evaluation and treatment. Sedation precautions given concerning medication given today and hydroxyzine. Red flag symptoms reviewed and return precautions given.      Final Clinical Impressions(s) / ED Diagnoses    Final diagnoses:   1. Swelling of left hand   2. Cellulitis of left upper extremity   3. Allergic reaction, initial encounter    Meds ordered this encounter  Medications   cefTRIAXone (ROCEPHIN) injection 1 g   methylPREDNISolone sodium succinate (SOLU-MEDROL) 125 mg/2 mL injection 125 mg   HYDROcodone-acetaminophen (NORCO/VICODIN) 5-325 MG per tablet 1 tablet    Refill:  0   amoxicillin-clavulanate (AUGMENTIN) 875-125 MG tablet    Sig: Take 1 tablet by mouth every 12 (twelve) hours.    Dispense:  20 tablet    Refill:  0   predniSONE (DELTASONE) 20 MG tablet    Sig: Take 2 tablets (40 mg total) by mouth daily.    Dispense:  10 tablet    Refill:  0   hydrOXYzine (ATARAX) 25 MG tablet    Sig: Take 1-2 tablets every 6  hours as needed for itching.    Dispense:  30 tablet  Refill:  0      Rolinda Rogue, MD 04/24/24 [phone]

## 2024-04-24 NOTE — ED Triage Notes (Signed)
 Patient here today with c/o left hand pain and swelling after being bit by a flying insect on his left ring finger.
